# Patient Record
Sex: Female | Born: 1935 | Race: White | Hispanic: No | Marital: Married | Smoking: Never smoker
Health system: Southern US, Community
[De-identification: ages and names within clinical notes are randomized; demographics above are authoritative.]

## PROBLEM LIST (undated history)

## (undated) DIAGNOSIS — B029 Zoster without complications: Secondary | ICD-10-CM

## (undated) HISTORY — PX: APPENDECTOMY: SHX54

## (undated) HISTORY — PX: ABDOMINAL HYSTERECTOMY: SHX81

---

## 2005-09-04 ENCOUNTER — Encounter: Admission: RE | Admit: 2005-09-04 | Discharge: 2005-09-04 | Payer: Self-pay | Admitting: Orthopedic Surgery

## 2005-09-18 ENCOUNTER — Encounter: Admission: RE | Admit: 2005-09-18 | Discharge: 2005-09-18 | Payer: Self-pay | Admitting: Internal Medicine

## 2006-09-11 ENCOUNTER — Encounter: Admission: RE | Admit: 2006-09-11 | Discharge: 2006-09-11 | Payer: Self-pay | Admitting: Family Medicine

## 2007-03-29 ENCOUNTER — Encounter: Admission: RE | Admit: 2007-03-29 | Discharge: 2007-03-29 | Payer: Self-pay | Admitting: Family Medicine

## 2007-09-18 ENCOUNTER — Encounter: Admission: RE | Admit: 2007-09-18 | Discharge: 2007-10-17 | Payer: Self-pay | Admitting: Family Medicine

## 2008-01-07 ENCOUNTER — Encounter: Admission: RE | Admit: 2008-01-07 | Discharge: 2008-01-07 | Payer: Self-pay | Admitting: Family Medicine

## 2009-07-19 ENCOUNTER — Encounter: Admission: RE | Admit: 2009-07-19 | Discharge: 2009-07-19 | Payer: Self-pay | Admitting: Family Medicine

## 2010-07-13 ENCOUNTER — Ambulatory Visit: Payer: Self-pay | Admitting: Physical Therapy

## 2010-07-14 ENCOUNTER — Ambulatory Visit: Payer: MEDICARE | Attending: Family Medicine | Admitting: Physical Therapy

## 2010-07-14 DIAGNOSIS — M2550 Pain in unspecified joint: Secondary | ICD-10-CM | POA: Insufficient documentation

## 2010-07-14 DIAGNOSIS — M25619 Stiffness of unspecified shoulder, not elsewhere classified: Secondary | ICD-10-CM | POA: Insufficient documentation

## 2010-07-14 DIAGNOSIS — R262 Difficulty in walking, not elsewhere classified: Secondary | ICD-10-CM | POA: Insufficient documentation

## 2010-07-14 DIAGNOSIS — IMO0001 Reserved for inherently not codable concepts without codable children: Secondary | ICD-10-CM | POA: Insufficient documentation

## 2010-07-14 DIAGNOSIS — R5381 Other malaise: Secondary | ICD-10-CM | POA: Insufficient documentation

## 2010-07-18 ENCOUNTER — Encounter: Payer: Self-pay | Admitting: Physical Therapy

## 2010-07-20 ENCOUNTER — Encounter: Payer: Self-pay | Admitting: Physical Therapy

## 2010-07-25 ENCOUNTER — Encounter: Payer: Self-pay | Admitting: Physical Therapy

## 2010-07-26 ENCOUNTER — Encounter: Payer: Self-pay | Admitting: Physical Therapy

## 2010-07-28 ENCOUNTER — Encounter: Payer: Self-pay | Admitting: Physical Therapy

## 2010-08-02 ENCOUNTER — Encounter: Payer: Self-pay | Admitting: Physical Therapy

## 2010-08-04 ENCOUNTER — Encounter: Payer: Self-pay | Admitting: Physical Therapy

## 2010-08-09 ENCOUNTER — Ambulatory Visit: Payer: MEDICARE | Attending: Family Medicine | Admitting: Physical Therapy

## 2010-08-09 DIAGNOSIS — IMO0001 Reserved for inherently not codable concepts without codable children: Secondary | ICD-10-CM | POA: Insufficient documentation

## 2010-08-09 DIAGNOSIS — M2550 Pain in unspecified joint: Secondary | ICD-10-CM | POA: Insufficient documentation

## 2010-08-09 DIAGNOSIS — R5381 Other malaise: Secondary | ICD-10-CM | POA: Insufficient documentation

## 2010-08-09 DIAGNOSIS — M25619 Stiffness of unspecified shoulder, not elsewhere classified: Secondary | ICD-10-CM | POA: Insufficient documentation

## 2010-08-09 DIAGNOSIS — R262 Difficulty in walking, not elsewhere classified: Secondary | ICD-10-CM | POA: Insufficient documentation

## 2010-08-11 ENCOUNTER — Ambulatory Visit: Payer: MEDICARE | Admitting: Physical Therapy

## 2010-08-16 ENCOUNTER — Ambulatory Visit: Payer: MEDICARE | Admitting: Physical Therapy

## 2010-08-18 ENCOUNTER — Ambulatory Visit: Payer: MEDICARE | Admitting: Physical Therapy

## 2010-08-23 ENCOUNTER — Encounter: Payer: MEDICARE | Admitting: Physical Therapy

## 2010-08-25 ENCOUNTER — Ambulatory Visit: Payer: MEDICARE | Admitting: Physical Therapy

## 2010-08-30 ENCOUNTER — Ambulatory Visit: Payer: MEDICARE | Admitting: Physical Therapy

## 2010-09-01 ENCOUNTER — Ambulatory Visit: Payer: MEDICARE | Admitting: Physical Therapy

## 2010-09-06 ENCOUNTER — Encounter: Payer: MEDICARE | Admitting: Physical Therapy

## 2010-09-08 ENCOUNTER — Encounter: Payer: MEDICARE | Admitting: Physical Therapy
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
## 2011-10-16 ENCOUNTER — Other Ambulatory Visit: Payer: Self-pay | Admitting: Family Medicine

## 2011-10-16 DIAGNOSIS — M79606 Pain in leg, unspecified: Secondary | ICD-10-CM

## 2011-10-17 ENCOUNTER — Other Ambulatory Visit: Payer: Medicare Other

## 2011-10-19 ENCOUNTER — Other Ambulatory Visit: Payer: Medicare Other

## 2014-06-27 ENCOUNTER — Encounter (HOSPITAL_COMMUNITY): Payer: Self-pay | Admitting: Emergency Medicine

## 2014-06-27 ENCOUNTER — Emergency Department (HOSPITAL_COMMUNITY)
Admission: EM | Admit: 2014-06-27 | Discharge: 2014-06-27 | Disposition: A | Discharge status: Home / Self Care | Payer: Medicare Other | Attending: Emergency Medicine | Admitting: Emergency Medicine

## 2014-06-27 DIAGNOSIS — R079 Chest pain, unspecified: Secondary | ICD-10-CM | POA: Diagnosis not present

## 2014-06-27 DIAGNOSIS — Z79899 Other long term (current) drug therapy: Secondary | ICD-10-CM | POA: Insufficient documentation

## 2014-06-27 DIAGNOSIS — B029 Zoster without complications: Secondary | ICD-10-CM | POA: Diagnosis not present

## 2014-06-27 DIAGNOSIS — Z880 Allergy status to penicillin: Secondary | ICD-10-CM | POA: Insufficient documentation

## 2014-06-27 DIAGNOSIS — R21 Rash and other nonspecific skin eruption: Secondary | ICD-10-CM | POA: Diagnosis present

## 2014-06-27 HISTORY — DX: Zoster without complications: B02.9

## 2014-06-27 MED ORDER — PREDNISONE 20 MG PO TABS
20.0000 mg | ORAL_TABLET | Freq: Once | ORAL | Status: AC
  Administered 2014-06-27: 20 mg via ORAL
  Filled 2014-06-27: qty 1

## 2014-06-27 MED ORDER — HYDROCODONE-ACETAMINOPHEN 5-325 MG PO TABS
ORAL_TABLET | ORAL | Status: AC
Start: 2014-06-27 — End: ?

## 2014-06-27 MED ORDER — HYDROCODONE-ACETAMINOPHEN 5-325 MG PO TABS
1.0000 | ORAL_TABLET | Freq: Once | ORAL | Status: AC
  Administered 2014-06-27: 1 via ORAL
  Filled 2014-06-27: qty 1

## 2014-06-27 MED ORDER — VALACYCLOVIR HCL 1 G PO TABS: 1000.0000 mg | ORAL_TABLET | Freq: Three times a day (TID) | ORAL | Status: AC

## 2014-06-27 NOTE — ED Provider Notes (Signed)
CSN: [id number]     Arrival date & time 06/27/14  1120 History   First MD Initiated Contact with Patient 06/27/14 1138     Chief Complaint  Patient presents with  . Herpes Zoster     HPI  Patient presents for evaluation of rash and pain along the right chest. Symptoms for 4 days. [place] base with blisters and painful. It's for a few days before becoming painful. Did not get vaccine for shingles. No prior episodes of zoster.  Past Medical History  Diagnosis Date  . Shingles    Past Surgical History  Procedure Laterality Date  . Appendectomy    . Abdominal hysterectomy     No family history on file. History  Substance Use Topics  . Smoking status: Never Smoker   . Smokeless tobacco: Not on file  . Alcohol Use: No   OB History    No data available     Review of Systems  Constitutional: Negative for fever, chills, diaphoresis, appetite change and fatigue.  HENT: Negative for mouth sores, sore throat and trouble swallowing.   Eyes: Negative for visual disturbance.  Respiratory: Negative for cough, chest tightness, shortness of breath and wheezing.   Cardiovascular: Positive for chest pain.  Gastrointestinal: Negative for nausea, vomiting, abdominal pain, diarrhea and abdominal distention.  Endocrine: Negative for polydipsia, polyphagia and polyuria.  Genitourinary: Negative for dysuria, frequency and hematuria.  Musculoskeletal: Negative for gait problem.  Skin: Positive for rash. Negative for color change and pallor.  Neurological: Negative for dizziness, syncope, light-headedness and headaches.  Hematological: Does not bruise/bleed easily.  Psychiatric/Behavioral: Negative for behavioral problems and confusion.      Allergies  Penicillins and Sulfa antibiotics  Home Medications   Prior to Admission medications   Medication Sig Start Date End Date Taking? Authorizing Provider  acetaminophen (TYLENOL) 500 MG tablet Take 1,000 mg by mouth 2 (two) times daily.   Yes  Historical Provider, MD  Multiple Vitamin (MULTIVITAMIN WITH MINERALS) TABS tablet Take 1 tablet by mouth daily.   Yes Historical Provider, MD  HYDROcodone-acetaminophen (NORCO/VICODIN) 5-325 MG per tablet 1/2-1 tablet q 4 hrs prn pain 06/27/14   Rolland PorterMark Tene Gato, MD  valACYclovir (VALTREX) 1000 MG tablet Take 1 tablet (1,000 mg total) by mouth 3 (three) times daily. 06/27/14   Rolland PorterMark Emilynn Srinivasan, MD   BP 153/81 mmHg  Pulse 110  Temp(Src) 98.4 F (36.9 C) (Oral)  Resp 18  SpO2 100% Physical Exam  Constitutional: She is oriented to person, place, and time. She appears well-developed and well-nourished. No distress.  HENT:  Head: Normocephalic.  Eyes: Conjunctivae are normal. Pupils are equal, round, and reactive to light. No scleral icterus.  Neck: Normal range of motion. Neck supple. No thyromegaly present.  Cardiovascular: Normal rate and regular rhythm.  Exam reveals no gallop and no friction rub.   No murmur heard. Pulmonary/Chest: Effort normal and breath sounds normal. No respiratory distress. She has no wheezes. She has no rales.      Abdominal: Soft. Bowel sounds are normal. She exhibits no distension. There is no tenderness. There is no rebound.  Musculoskeletal: Normal range of motion.  Neurological: She is alert and oriented to person, place, and time.  Skin: Skin is warm and dry. No rash noted.  Psychiatric: She has a normal mood and affect. Her behavior is normal.    ED Course  Procedures (including critical care time) Labs Review Labs Reviewed - No data to display  Imaging Review No results found.  EKG Interpretation None      MDM   Final diagnoses:  Herpes zoster    The secondary infection. Classic dermatomal zoster. Given Vicodin here. Advised to use 1/2-1 tablet as needed with consideration for her age. Family will be with her today. Given single-dose by mouth prednisone to hopefully for some postherpetic neuralgia. Valtrex prescription as well.   Rolland Porter,  MD 06/27/14 [phone]

## 2014-06-27 NOTE — Discharge Instructions (Signed)

## 2014-06-27 NOTE — ED Notes (Signed)
Per pt, states she notice painful rash on right mid back to right torso-painful
